# Patient Record
Sex: Female | Born: 1975 | Hispanic: No | Marital: Married | Smoking: Never smoker
Health system: Southern US, Community
[De-identification: ages and names within clinical notes are randomized; demographics above are authoritative.]

## PROBLEM LIST (undated history)

## (undated) DIAGNOSIS — E7800 Pure hypercholesterolemia, unspecified: Secondary | ICD-10-CM

## (undated) DIAGNOSIS — E785 Hyperlipidemia, unspecified: Secondary | ICD-10-CM

## (undated) DIAGNOSIS — O1490 Unspecified pre-eclampsia, unspecified trimester: Secondary | ICD-10-CM

## (undated) HISTORY — PX: APPENDECTOMY: SHX54

## (undated) HISTORY — PX: HERNIA REPAIR: SHX51

---

## 1998-10-20 ENCOUNTER — Inpatient Hospital Stay (HOSPITAL_COMMUNITY): Admission: AD | Admit: 1998-10-20 | Discharge: 1998-10-22 | Payer: Self-pay | Admitting: Obstetrics and Gynecology

## 1998-11-21 ENCOUNTER — Encounter (HOSPITAL_COMMUNITY): Admission: RE | Admit: 1998-11-21 | Discharge: 1999-02-19 | Payer: Self-pay | Admitting: Obstetrics and Gynecology

## 2004-09-01 ENCOUNTER — Inpatient Hospital Stay (HOSPITAL_COMMUNITY): Admission: RE | Admit: 2004-09-01 | Discharge: 2004-09-01 | Payer: Self-pay | Admitting: Obstetrics and Gynecology

## 2004-09-02 ENCOUNTER — Inpatient Hospital Stay (HOSPITAL_COMMUNITY): Admission: AD | Admit: 2004-09-02 | Discharge: 2004-09-04 | Payer: Self-pay | Admitting: Obstetrics and Gynecology

## 2004-10-11 ENCOUNTER — Encounter: Admission: RE | Admit: 2004-10-11 | Discharge: 2004-10-11 | Payer: Self-pay | Admitting: Obstetrics and Gynecology

## 2005-01-22 ENCOUNTER — Encounter: Admission: RE | Admit: 2005-01-22 | Discharge: 2005-01-22 | Payer: Self-pay | Admitting: Obstetrics and Gynecology

## 2015-10-27 ENCOUNTER — Emergency Department (HOSPITAL_BASED_OUTPATIENT_CLINIC_OR_DEPARTMENT_OTHER)
Admission: EM | Admit: 2015-10-27 | Discharge: 2015-10-27 | Disposition: A | Discharge status: Home / Self Care | Payer: Medicaid Other | Attending: Emergency Medicine | Admitting: Emergency Medicine

## 2015-10-27 ENCOUNTER — Emergency Department (HOSPITAL_BASED_OUTPATIENT_CLINIC_OR_DEPARTMENT_OTHER): Payer: Medicaid Other

## 2015-10-27 ENCOUNTER — Encounter (HOSPITAL_BASED_OUTPATIENT_CLINIC_OR_DEPARTMENT_OTHER): Payer: Self-pay | Admitting: *Deleted

## 2015-10-27 DIAGNOSIS — M25461 Effusion, right knee: Secondary | ICD-10-CM | POA: Diagnosis not present

## 2015-10-27 DIAGNOSIS — L700 Acne vulgaris: Secondary | ICD-10-CM | POA: Diagnosis not present

## 2015-10-27 DIAGNOSIS — M25561 Pain in right knee: Secondary | ICD-10-CM | POA: Diagnosis present

## 2015-10-27 DIAGNOSIS — M7981 Nontraumatic hematoma of soft tissue: Secondary | ICD-10-CM | POA: Diagnosis not present

## 2015-10-27 DIAGNOSIS — R52 Pain, unspecified: Secondary | ICD-10-CM

## 2015-10-27 DIAGNOSIS — Z880 Allergy status to penicillin: Secondary | ICD-10-CM | POA: Diagnosis not present

## 2015-10-27 DIAGNOSIS — M79604 Pain in right leg: Secondary | ICD-10-CM

## 2015-10-27 DIAGNOSIS — M7989 Other specified soft tissue disorders: Secondary | ICD-10-CM

## 2015-10-27 DIAGNOSIS — Z791 Long term (current) use of non-steroidal anti-inflammatories (NSAID): Secondary | ICD-10-CM | POA: Diagnosis not present

## 2015-10-27 MED ORDER — IBUPROFEN 800 MG PO TABS: 800.0000 mg | ORAL_TABLET | Freq: Three times a day (TID) | ORAL | Status: DC

## 2015-10-27 NOTE — ED Provider Notes (Signed)
CSN: [id number]     Arrival date & time 10/27/15  1840 History   First MD Initiated Contact with Patient 10/27/15 1928     Chief Complaint  Patient presents with  . Knee Pain     (Consider location/radiation/quality/duration/timing/severity/associated sxs/prior Treatment) HPI   Patient is a 39 year old female who presents to the ED with complaint of right knee pain, onset 1 month. Patient reports she has been having constant aching pain to her right knee, pain worse with walking or when going upstairs. She notes she noticed a small amount of swelling and bruising to her right lower leg approximately one week ago. Denies any recent fall, trauma or injury but notes that approximately one month ago she remembers twisting her ankle while wearing heels. She notes she was seen at urgent care today and was advised to be evaluated in the ED due to swelling and concern for DVT. Denies fever, chills, redness, numbness, tingling, weakness. Denies history of blood clots, recent surgery or immobilization, active cancer. Denies taking any medications prior to arrival  History reviewed. No pertinent past medical history. History reviewed. No pertinent past surgical history. No family history on file. Social History  Substance Use Topics  . Smoking status: Never Smoker   . Smokeless tobacco: None  . Alcohol Use: No   OB History    No data available     Review of Systems  Constitutional: Negative for fever and chills.  Respiratory: Negative for shortness of breath.   Cardiovascular: Negative for chest pain.  Gastrointestinal: Negative for nausea, vomiting and abdominal pain.  Musculoskeletal: Positive for joint swelling and arthralgias (right knee).  Neurological: Negative for weakness, numbness and headaches.      Allergies  Penicillins  Home Medications   Prior to Admission medications   Medication Sig Start Date End Date Taking? Authorizing Provider  ibuprofen (ADVIL,MOTRIN) 800 MG  tablet Take 1 tablet (800 mg total) by mouth 3 (three) times daily. 10/27/15   Satira Sark Nadeau, PA-C   BP 118/76 mmHg  Pulse 80  Temp(Src) 98.1 F (36.7 C) (Oral)  Resp 16  Ht 5' (1.524 m)  Wt 80.74 kg  BMI 34.76 kg/m2  SpO2 100% Physical Exam  Constitutional: She is oriented to person, place, and time. She appears well-developed and well-nourished. No distress.  HENT:  Head: Normocephalic and atraumatic.  Eyes: Conjunctivae and EOM are normal. Right eye exhibits no discharge. Left eye exhibits no discharge. No scleral icterus.  Neck: Normal range of motion. Neck supple.  Cardiovascular: Normal rate, regular rhythm, normal heart sounds and intact distal pulses.   Pulmonary/Chest: Effort normal and breath sounds normal. No respiratory distress. She has no wheezes. She has no rales. She exhibits no tenderness.  Abdominal: Soft. Bowel sounds are normal. She exhibits no distension and no mass. There is no tenderness. There is no rebound and no guarding.  Musculoskeletal: Normal range of motion.       Right knee: She exhibits swelling. She exhibits normal range of motion, no effusion, no ecchymosis, no deformity, no laceration, no erythema, normal alignment, no LCL laxity, normal patellar mobility, normal meniscus and no MCL laxity. Tenderness found. Lateral joint line and patellar tendon tenderness noted.       Legs: Mild swelling noted to right lower extremity extending up to knee. Full range of motion of right knee. 5/5 strength. Sensation intact. 2+ DP pulses.  Neurological: She is alert and oriented to person, place, and time.  Skin: Skin is warm and  dry. She is not diaphoretic.  Nursing note and vitals reviewed.   ED Course  Procedures (including critical care time) Labs Review Labs Reviewed - No data to display  Imaging Review US Venous Img Lower Unilateral Right  10/27/2015  CLINICAL DATA:  Pain, swelling and ecchymosis at the medial aspect of the right knee. EXAM: Right  LOWER EXTREMITY VENOUS DOPPLER ULTRASOUND TECHNIQUE: Gray-scale sonography with graded compression, as well as color Doppler and duplex ultrasound were performed to evaluate the lower extremity deep venous systems from the level of the common femoral vein and including the common femoral, femoral, profunda femoral, popliteal and calf veins including the posterior tibial, peroneal and gastrocnemius veins when visible. The superficial great saphenous vein was also interrogated. Spectral Doppler was utilized to evaluate flow at rest and with distal augmentation maneuvers in the common femoral, femoral and popliteal veins. COMPARISON:  None. FINDINGS: Contralateral Common Femoral Vein: Respiratory phasicity is normal and symmetric with the symptomatic side. No evidence of thrombus. Normal compressibility. Common Femoral Vein: No evidence of thrombus. Normal compressibility, respiratory phasicity and response to augmentation. Saphenofemoral Junction: No evidence of thrombus. Normal compressibility and flow on color Doppler imaging. Profunda Femoral Vein: No evidence of thrombus. Normal compressibility and flow on color Doppler imaging. Femoral Vein: No evidence of thrombus. Normal compressibility, respiratory phasicity and response to augmentation. Popliteal Vein: No evidence of thrombus. Normal compressibility, respiratory phasicity and response to augmentation. Calf Veins: No evidence of thrombus. Normal compressibility and flow on color Doppler imaging. Superficial Great Saphenous Vein: No evidence of thrombus. Normal compressibility and flow on color Doppler imaging. Venous Reflux:  None. Other Findings: In the area of concern at the medial aspect of the right knee there is a complex medium level echogenicity collection which may represent a hematoma. IMPRESSION: No evidence of deep venous thrombosis. Possible hematoma at the medial aspect of the right knee, not conclusively characterized. Electronically Signed   By:  Ellery Plunk M.D.   On: 10/27/2015 21:50   Dg Knee Complete 4 Views Right  10/27/2015  CLINICAL DATA:  Right knee pain and swelling for a month without known trauma. EXAM: RIGHT KNEE - COMPLETE 4+ VIEW COMPARISON:  None. FINDINGS: There is no evidence of fracture, dislocation, or joint effusion. There is no evidence of arthropathy or other focal bone abnormality. Soft tissues are unremarkable. IMPRESSION: Negative. Electronically Signed   By: Ellery Plunk M.D.   On: 10/27/2015 22:07   I have personally reviewed and evaluated these images and lab results as part of my medical decision-making.  Filed Vitals:   10/27/15 1845 10/27/15 2112  BP: 143/94 118/76  Pulse: 94 80  Temp: 98.1 F (36.7 C)   Resp: 16 16     MDM   Final diagnoses:  Right knee pain    Patient presents with right knee pain and swelling for the past month. Denies any known fall, trauma, injury. Denies history of blood clots or recent surgery/immobilization. Patient was seen at urgent care prior to arrival and advised to come to the ED due to concern for DVT. VSS. Exam revealed mild swelling and ecchymosis to area inferior to right medial knee. Mild swelling noted to right lower extremity compared to left, no calf pain. Right lower extremity neurovascularly intact. Pt is able ambulate, no bony abnormality or deformity, no erythema or excessive heat, no evidence of cellulitis, or septic joint.  Dr. Cyndie Chime evaluated pt.  Plan to order Doppler ultrasound to evaluate for DVT. During exam she  noticed a small blackhead to right lower back, unable to expel blackhead with manual pressure.Lower extremity ultrasound showed no evidence of DVT. Right knee x-ray negative. Discussed results and plan for discharge. Patient given prescription for NSAIDs and follow-up for orthopedists. Patient also given dermatology follow-up regarding skin lesion.  Evaluation does not show pathology requring ongoing emergent intervention or  admission. Pt is hemodynamically stable and mentating appropriately. All questions answered. Return precautions discussed and outpatient follow up given.      Satira Sarkicole Elizabeth IvanhoeNadeau, [place] JerseyPA-C 10/27/15 2230  Leta BaptistEmily Roe Nguyen, MD 10/28/15 1014

## 2015-10-27 NOTE — Discharge Instructions (Signed)
Take your medication as prescribed as needed for pain relief. You may also apply ice for 15-20 minutes to affected area 3-4 times daily as needed for pain relief. Follow up with orthopedics regarding your knee pain. I also recommend following up with dermatology regarding the skin lesion/pimple on your back. Please return to the Emergency Department if symptoms worsen or new onset of fever, numbness, tingling, weakness.

## 2015-10-27 NOTE — ED Notes (Signed)
Right knee pain for a month. [facility] and told to come to the ED due to swelling.

## 2015-11-23 ENCOUNTER — Ambulatory Visit: Payer: Medicaid Other

## 2015-12-07 ENCOUNTER — Ambulatory Visit: Payer: Medicaid Other

## 2018-12-07 ENCOUNTER — Other Ambulatory Visit: Payer: Self-pay

## 2018-12-07 ENCOUNTER — Emergency Department (HOSPITAL_BASED_OUTPATIENT_CLINIC_OR_DEPARTMENT_OTHER): Payer: Medicaid Other

## 2018-12-07 ENCOUNTER — Encounter (HOSPITAL_BASED_OUTPATIENT_CLINIC_OR_DEPARTMENT_OTHER): Payer: Self-pay

## 2018-12-07 ENCOUNTER — Emergency Department (HOSPITAL_BASED_OUTPATIENT_CLINIC_OR_DEPARTMENT_OTHER)
Admission: EM | Admit: 2018-12-07 | Discharge: 2018-12-08 | Disposition: A | Discharge status: Home / Self Care | Payer: Medicaid Other | Attending: Emergency Medicine | Admitting: Emergency Medicine

## 2018-12-07 DIAGNOSIS — R079 Chest pain, unspecified: Secondary | ICD-10-CM | POA: Insufficient documentation

## 2018-12-07 DIAGNOSIS — R6889 Other general symptoms and signs: Secondary | ICD-10-CM

## 2018-12-07 DIAGNOSIS — R42 Dizziness and giddiness: Secondary | ICD-10-CM | POA: Diagnosis present

## 2018-12-07 DIAGNOSIS — Z7902 Long term (current) use of antithrombotics/antiplatelets: Secondary | ICD-10-CM | POA: Insufficient documentation

## 2018-12-07 DIAGNOSIS — Z79899 Other long term (current) drug therapy: Secondary | ICD-10-CM | POA: Diagnosis not present

## 2018-12-07 DIAGNOSIS — J111 Influenza due to unidentified influenza virus with other respiratory manifestations: Secondary | ICD-10-CM | POA: Insufficient documentation

## 2018-12-07 DIAGNOSIS — R509 Fever, unspecified: Secondary | ICD-10-CM

## 2018-12-07 HISTORY — DX: Unspecified pre-eclampsia, unspecified trimester: O14.90

## 2018-12-07 HISTORY — DX: Hyperlipidemia, unspecified: E78.5

## 2018-12-07 LAB — URINALYSIS, ROUTINE W REFLEX MICROSCOPIC
Bilirubin Urine: NEGATIVE
Glucose, UA: NEGATIVE mg/dL
Ketones, ur: NEGATIVE mg/dL
Leukocytes, UA: NEGATIVE
Nitrite: NEGATIVE
Protein, ur: NEGATIVE mg/dL
Specific Gravity, Urine: <1.005 — ABNORMAL LOW (ref 1.005–1.030)
pH: 5.5 (ref 5.0–8.0)

## 2018-12-07 LAB — COMPREHENSIVE METABOLIC PANEL
ALT: 19 U/L (ref 0–44)
AST: 16 U/L (ref 15–41)
Albumin: 3.9 g/dL (ref 3.5–5.0)
Alkaline Phosphatase: 44 U/L (ref 38–126)
Anion gap: 10 (ref 5–15)
BUN: 13 mg/dL (ref 6–20)
CO2: 22 mmol/L (ref 22–32)
Calcium: 8.9 mg/dL (ref 8.9–10.3)
Chloride: 102 mmol/L (ref 98–111)
Creatinine, Ser: 0.87 mg/dL (ref 0.44–1.00)
GFR calc Af Amer: >60 mL/min (ref >60)
GFR calc non Af Amer: >60 mL/min (ref >60)
Glucose, Bld: 138 mg/dL — ABNORMAL HIGH (ref 70–99)
Potassium: 3.9 mmol/L (ref 3.5–5.1)
Sodium: 134 mmol/L — ABNORMAL LOW (ref 135–145)
Total Bilirubin: 0.4 mg/dL (ref 0.3–1.2)
Total Protein: 7.2 g/dL (ref 6.5–8.1)

## 2018-12-07 LAB — CBC WITH DIFFERENTIAL/PLATELET
Abs Immature Granulocytes: 0 10*3/uL (ref 0.00–0.07)
Basophils Absolute: 0 10*3/uL (ref 0.0–0.1)
Basophils Relative: 0 %
Eosinophils Absolute: 0 10*3/uL (ref 0.0–0.5)
Eosinophils Relative: 1 %
HCT: 39.2 % (ref 36.0–46.0)
Hemoglobin: 12.4 g/dL (ref 12.0–15.0)
Immature Granulocytes: 0 %
Lymphocytes Relative: 9 %
Lymphs Abs: 0.3 10*3/uL — ABNORMAL LOW (ref 0.7–4.0)
MCH: 28.2 pg (ref 26.0–34.0)
MCHC: 31.6 g/dL (ref 30.0–36.0)
MCV: 89.1 fL (ref 80.0–100.0)
Monocytes Absolute: 0.4 10*3/uL (ref 0.1–1.0)
Monocytes Relative: 12 %
Neutro Abs: 2.8 10*3/uL (ref 1.7–7.7)
Neutrophils Relative %: 78 %
Platelets: 144 10*3/uL — ABNORMAL LOW (ref 150–400)
RBC: 4.4 MIL/uL (ref 3.87–5.11)
RDW: 13.3 % (ref 11.5–15.5)
WBC: 3.6 10*3/uL — ABNORMAL LOW (ref 4.0–10.5)
nRBC: 0 % (ref 0.0–0.2)

## 2018-12-07 LAB — I-STAT CG4 LACTIC ACID, ED: Lactic Acid, Venous: 1.5 mmol/L (ref 0.5–1.9)

## 2018-12-07 LAB — URINALYSIS, MICROSCOPIC (REFLEX)

## 2018-12-07 LAB — CBG MONITORING, ED: Glucose-Capillary: 109 mg/dL — ABNORMAL HIGH (ref 70–99)

## 2018-12-07 LAB — PREGNANCY, URINE: Preg Test, Ur: NEGATIVE

## 2018-12-07 LAB — TROPONIN I: Troponin I: <0.03 ng/mL (ref <0.03)

## 2018-12-07 MED ORDER — SODIUM CHLORIDE 0.9 % IV BOLUS
1000.0000 mL | Freq: Once | INTRAVENOUS | Status: AC
  Administered 2018-12-07: 1000 mL via INTRAVENOUS

## 2018-12-07 MED ORDER — KETOROLAC TROMETHAMINE 15 MG/ML IJ SOLN
15.0000 mg | Freq: Once | INTRAMUSCULAR | Status: AC
  Administered 2018-12-07: 15 mg via INTRAVENOUS
  Filled 2018-12-07: qty 1

## 2018-12-07 MED ORDER — SODIUM CHLORIDE 0.9 % IV BOLUS
1000.0000 mL | Freq: Once | INTRAVENOUS | Status: AC
  Administered 2018-12-08: 1000 mL via INTRAVENOUS

## 2018-12-07 MED ORDER — ACETAMINOPHEN 500 MG PO TABS
1000.0000 mg | ORAL_TABLET | Freq: Once | ORAL | Status: AC
  Administered 2018-12-07: 1000 mg via ORAL
  Filled 2018-12-07: qty 2

## 2018-12-07 NOTE — ED Triage Notes (Signed)
Pt presents with hypertension, dizziness, fever and weakness. Symptoms began this AM.

## 2018-12-07 NOTE — ED Notes (Addendum)
Pt c/o dizziness, weakness and generalized body aches that started today. Pts general appearance is mildly lethargic with c/o tiredness and pain. Pt denies nausea or vomiting. Pts c/o of chills. Pt also states she is non compliant with b/p and cholesterol medications.

## 2018-12-08 MED ORDER — MECLIZINE HCL 25 MG PO TABS
25.0000 mg | ORAL_TABLET | Freq: Once | ORAL | Status: AC
  Administered 2018-12-08: 25 mg via ORAL
  Filled 2018-12-08: qty 1

## 2018-12-08 MED ORDER — OSELTAMIVIR PHOSPHATE 75 MG PO CAPS
75.0000 mg | ORAL_CAPSULE | Freq: Once | ORAL | Status: AC
  Administered 2018-12-08: 75 mg via ORAL
  Filled 2018-12-08: qty 1

## 2018-12-08 MED ORDER — OSELTAMIVIR PHOSPHATE 75 MG PO CAPS: 75.0000 mg | ORAL_CAPSULE | Freq: Two times a day (BID) | ORAL | 0 refills | Status: AC

## 2018-12-08 MED ORDER — IBUPROFEN 400 MG PO TABS
600.0000 mg | ORAL_TABLET | Freq: Once | ORAL | Status: AC
  Administered 2018-12-08: 600 mg via ORAL
  Filled 2018-12-08: qty 1

## 2018-12-08 NOTE — ED Notes (Signed)
Pt amublated without any assistance. SpO2 98-99, Heart rate 108-109.

## 2018-12-08 NOTE — ED Provider Notes (Addendum)
Planes of frontal headache diffuse myalgias fever and lightheadedness onset gradually this morning becoming worse at 6 PM tonight.  Denies cough or sore throat she denies neck pain or neck stiffness.  On exam she is alert nontoxic appearing HEENT exam oropharynx is normal.  Mucous membranes moist neck is supple no signs of meningitis heart tachycardic regular rhythm no murmurs all 4 extremities are redness swelling or tenderness neurovascularly intact.  I suspect the patient has early stages of flulike illness.  Strongly doubt meningitis.  Frontal headache.  No neck pain or stiffness.  Nontoxic-appearing suggest close follow-up with primary care physician   Doug Sou, MD 12/08/18 6468    Doug Sou, MD 12/08/18 [phone]

## 2018-12-08 NOTE — ED Provider Notes (Signed)
[facility] EMERGENCY DEPARTMENT Provider Note   CSN: [id number] Arrival date & time: 12/07/18  2147     History   Chief Complaint Chief Complaint  Patient presents with  . Dizziness    HPI Elizabeth Hampton is a 43 y.o. female presenting for evaluation of body aches, headache, and weakness.  Patient states symptoms began this morning when she woke up.  They have gradually worsened throughout today.  Patient states she is having a persistent frontal headache.  Additionally, she reports pain all over her body.  She feels like she is very hot.  When she was walking today, she felt lightheaded.  She states she feels like her heart is racing.  She denies ear pain, nasal congestion, sore throat, cough, chest pain, nausea, vomiting, abdominal pain, urinary symptoms, normal bowel movements.  She denies sick contacts.  She denies recent travel.  She reports a history of hypertension and hyperlipidemia for which she is noncompliant with her medications.  She denies tobacco use.  HPI  Past Medical History:  Diagnosis Date  . Hyperlipidemia   . Preeclampsia     There are no active problems to display for this patient.   History reviewed. No pertinent surgical history.   OB History   No obstetric history on file.      Home Medications    Prior to Admission medications   Medication Sig Start Date End Date Taking? Authorizing Provider  hydrochlorothiazide (HYDRODIURIL) 12.5 MG tablet Take by mouth. 10/02/17  Yes [provider]  pravastatin (PRAVACHOL) 10 MG tablet Take by mouth. 12/04/17  Yes [provider]  ibuprofen (ADVIL,MOTRIN) 800 MG tablet Take 1 tablet (800 mg total) by mouth 3 (three) times daily. 10/27/15   Barrett Henle, PA-C  oseltamivir (TAMIFLU) 75 MG capsule Take 1 capsule (75 mg total) by mouth every 12 (twelve) hours for 5 days. 12/08/18 12/13/18  Sharnise Blough, PA-C    Family History No family history on file.  Social  History Social History   Tobacco Use  . Smoking status: Never Smoker  . Smokeless tobacco: Never Used  Substance Use Topics  . Alcohol use: No  . Drug use: No     Allergies   Atorvastatin and Penicillins   Review of Systems Review of Systems  Constitutional: Positive for fever.  Cardiovascular: Positive for palpitations.  Musculoskeletal: Positive for myalgias.  Neurological: Positive for light-headedness and headaches.  All other systems reviewed and are negative.    Physical Exam Updated Vital Signs BP 112/71 (BP Location: Right Arm) Comment: Simultaneous filing. User may not have seen previous data.  Pulse 98   Temp (!) 100.5 F (38.1 C) (Rectal)   Resp (!) 24   Ht 5\' 6"  (1.676 m)   Wt 77.1 kg   LMP 11/15/2018   SpO2 96%   BMI 27.44 kg/m   Physical Exam Vitals signs and nursing note reviewed.  Constitutional:      General: She is not in acute distress.    Appearance: She is well-developed.     Comments: Appears uncomfortable, but in no acute distress  HENT:     Head: Normocephalic and atraumatic.     Comments: OP clear without tonsillar swelling or exudate.  Uvula midline with equal palate rise.  TMs nonerythematous nonbulging bilaterally. Eyes:     Conjunctiva/sclera: Conjunctivae normal.     Pupils: Pupils are equal, round, and reactive to light.     Comments: EOMI and PERRLA.  No nystagmus.  Neck:  Musculoskeletal: Normal range of motion and neck supple.     Comments: No neck stiffness or signs of meningismus Cardiovascular:     Rate and Rhythm: Regular rhythm. Tachycardia present.     Pulses: Normal pulses.     Comments: tachycardiac around 140 Pulmonary:     Effort: Pulmonary effort is normal. No respiratory distress.     Breath sounds: Normal breath sounds. No wheezing.     Comments: Speaking in full sentences.  Clear lung sounds in all fields. Abdominal:     General: There is no distension.     Palpations: Abdomen is soft. There is no  mass.     Tenderness: There is no abdominal tenderness. There is no guarding or rebound.  Musculoskeletal: Normal range of motion.     Comments: Strength intact x4.  Sensation intact x4.  Radial pedal pulses intact bilaterally.  No leg pain or swelling.  Skin:    General: Skin is warm and dry.     Capillary Refill: Capillary refill takes less than 2 seconds.  Neurological:     General: No focal deficit present.     Mental Status: She is alert and oriented to person, place, and time.     Comments: No obvious neurologic deficit.  CN intact.  Grip strength intact.      ED Treatments / Results  Labs (all labs ordered are listed, but only abnormal results are displayed) Labs Reviewed  URINALYSIS, ROUTINE W REFLEX MICROSCOPIC - Abnormal; Notable for the following components:      Result Value   Specific Gravity, Urine <1.005 (*)    Hgb urine dipstick TRACE (*)    All other components within normal limits  COMPREHENSIVE METABOLIC PANEL - Abnormal; Notable for the following components:   Sodium 134 (*)    Glucose, Bld 138 (*)    All other components within normal limits  CBC WITH DIFFERENTIAL/PLATELET - Abnormal; Notable for the following components:   WBC 3.6 (*)    Platelets 144 (*)    Lymphs Abs 0.3 (*)    All other components within normal limits  URINALYSIS, MICROSCOPIC (REFLEX) - Abnormal; Notable for the following components:   Bacteria, UA RARE (*)    All other components within normal limits  CBG MONITORING, ED - Abnormal; Notable for the following components:   Glucose-Capillary 109 (*)    All other components within normal limits  CULTURE, BLOOD (ROUTINE X 2)  CULTURE, BLOOD (ROUTINE X 2)  PREGNANCY, URINE  TROPONIN I  I-STAT CG4 LACTIC ACID, ED    EKG EKG Interpretation  Date/Time:  Sunday December 07 2018 22:00:54 EST Ventricular Rate:  137 PR Interval:  116 QRS Duration: 76 QT Interval:  292 QTC Calculation: 440 R Axis:   66 Text Interpretation:  Sinus  tachycardia Otherwise normal ECG No old tracing to compare Confirmed by Satilla, Doreatha Martin [phone]) on 12/07/2018 10:17:07 PM   Radiology Dg Chest 2 View  Result Date: 12/07/2018 CLINICAL DATA:  Chest pain EXAM: CHEST - 2 VIEW COMPARISON:  None. FINDINGS: Minimal bibasilar opacities, likely atelectasis. No pleural effusion or pneumothorax. The heart is normal in size. Visualized osseous structures are within normal limits. IMPRESSION: No evidence of acute cardiopulmonary disease. Electronically Signed   By: Charline Bills M.D.   On: 12/07/2018 23:52    Procedures Procedures (including critical care time)  Medications Ordered in ED Medications  acetaminophen (TYLENOL) tablet 1,000 mg (1,000 mg Oral Given 12/07/18 2224)  sodium chloride 0.9 % bolus 1,000  mL (0 mLs Intravenous Stopped 12/07/18 2324)  ketorolac (TORADOL) 15 MG/ML injection 15 mg (15 mg Intravenous Given 12/07/18 2324)  sodium chloride 0.9 % bolus 1,000 mL (0 mLs Intravenous Stopped 12/08/18 0141)  oseltamivir (TAMIFLU) capsule 75 mg (75 mg Oral Given 12/08/18 0026)  ibuprofen (ADVIL,MOTRIN) tablet 600 mg (600 mg Oral Given 12/08/18 0026)  meclizine (ANTIVERT) tablet 25 mg (25 mg Oral Given 12/08/18 0141)     Initial Impression / Assessment and Plan / ED Course  I have reviewed the triage vital signs and the nursing notes.  Pertinent labs & imaging results that were available during my care of the patient were reviewed by me and considered in my medical decision making (see chart for details).     Patient presenting for evaluation of fever, palpitations, lightheadedness, and headache.  Physical exam shows patient who is febrile and tachycardic.  Has symptoms again all the setting, with very elevated temperature of 104, consider flu.  However, patient without other flulike symptoms.  Headache is frontal, and no signs of neck stiffness or meningismus.  Low suspicion for meningitis at this time.  Will start infectious work-up with  labs, lactic, urine, chest x-ray.  Additionally, as patient is tachycardic and having palpitations, will obtain EKG and troponin.  Will give fluids and tylneol for sx control.   EKG shows sinus tach, no STEMI.  Troponin negative.  Labs reassuring, lactic negative.  Mild leukopenia, consistent with viral illness.  Otherwise electrolytes are reassuring.  Chest x-ray viewed interpreted by me, no pneumonia, pneumothorax, effusion, or cardiomegaly.  Urine without infection.  Heart rate improving, 120.  Fever improving to 103.  Will give Toradol for continued aches and fever.  Second liter of fluid ordered.  Case discussed with attending, Dr. Ethelda ChickJacubowitz evaluated the patient.  Heart rate continuing to improve, went down to 98.  Temperature improved to 100.5.  sxs likely flu ir other virus. Will treat empirically with tamiflu. Patient ambulated without difficulty or signs of shortness of breath.  Encouraged close follow-up with PCP. At this time, pt appears safe for d/c. Return precautions given. Pt states she understands and agrees to plan.  Final Clinical Impressions(s) / ED Diagnoses   Final diagnoses:  Flu-like symptoms  Fever, unspecified fever cause    ED Discharge Orders         Ordered    oseltamivir (TAMIFLU) 75 MG capsule  Every 12 hours     12/08/18 0132           Alveria ApleyCaccavale, Raiden Haydu, PA-C 12/08/18 0156    Doug SouJacubowitz, Sam, MD 12/08/18 1054

## 2018-12-08 NOTE — Discharge Instructions (Addendum)
I am concerned your symptoms are early flu.  The flu is a virus, which is treated symptomatically. We are giving medication for flu called tamiflu. This may cause nausea and/or diarrhea.  Use Tylenol and ibuprofen as needed for fever or body aches.  As the medicine wears off, your fever and aches will likely return.  This will continue for several days. Make sure you are staying well-hydrated with water.  This is very important. You may develop cough, sore throat, congestion over the next several days.  Treat symptomatically. Follow-up with your primary care doctor tomorrow for recheck of your symptoms. Return to the emergency room with any new, worsening, concerning symptoms.

## 2018-12-13 LAB — CULTURE, BLOOD (ROUTINE X 2)
CULTURE: NO GROWTH
CULTURE: NO GROWTH
Special Requests: ADEQUATE
Special Requests: ADEQUATE

## 2020-12-07 ENCOUNTER — Emergency Department (HOSPITAL_BASED_OUTPATIENT_CLINIC_OR_DEPARTMENT_OTHER)
Admission: EM | Admit: 2020-12-07 | Discharge: 2020-12-07 | Disposition: A | Discharge status: Home / Self Care | Payer: Medicaid Other | Attending: Emergency Medicine | Admitting: Emergency Medicine

## 2020-12-07 ENCOUNTER — Encounter (HOSPITAL_BASED_OUTPATIENT_CLINIC_OR_DEPARTMENT_OTHER): Payer: Self-pay | Admitting: Emergency Medicine

## 2020-12-07 ENCOUNTER — Other Ambulatory Visit: Payer: Self-pay

## 2020-12-07 DIAGNOSIS — U071 COVID-19: Secondary | ICD-10-CM | POA: Diagnosis not present

## 2020-12-07 DIAGNOSIS — Z20822 Contact with and (suspected) exposure to covid-19: Secondary | ICD-10-CM

## 2020-12-07 DIAGNOSIS — R059 Cough, unspecified: Secondary | ICD-10-CM | POA: Diagnosis present

## 2020-12-07 LAB — SARS CORONAVIRUS 2 (TAT 6-24 HRS): SARS Coronavirus 2: POSITIVE — AB

## 2020-12-07 MED ORDER — IBUPROFEN 800 MG PO TABS
800.0000 mg | ORAL_TABLET | Freq: Once | ORAL | Status: AC
  Administered 2020-12-07: 800 mg via ORAL
  Filled 2020-12-07: qty 1

## 2020-12-07 MED ORDER — LIDOCAINE VISCOUS HCL 2 % MT SOLN
15.0000 mL | Freq: Once | OROMUCOSAL | Status: AC
  Administered 2020-12-07: 15 mL via OROMUCOSAL
  Filled 2020-12-07: qty 15

## 2020-12-07 NOTE — ED Triage Notes (Signed)
Nasal congestion, cough and scratchy throat.

## 2020-12-07 NOTE — Discharge Instructions (Addendum)
Person Under Monitoring Name: Elizabeth Hampton  Location: [address] [place] [address]   Infection Prevention Recommendations for Individuals Confirmed to have, or Being Evaluated for, 2019 Novel Coronavirus (COVID-19) Infection Who Receive Care at Home  Individuals who are confirmed to have, or are being evaluated for, COVID-19 should follow the prevention steps below until a healthcare provider or local or state health department says they can return to normal activities.  Stay home except to get medical care You should restrict activities outside your home, except for getting medical care. Do not go to work, school, or public areas, and do not use public transportation or taxis.  Call ahead before visiting your doctor Before your medical appointment, call the healthcare provider and tell them that you have, or are being evaluated for, COVID-19 infection. This will help the healthcare provider's office take steps to keep other people from getting infected. Ask your healthcare provider to call the local or state health department.  Monitor your symptoms Seek prompt medical attention if your illness is worsening (e.g., difficulty breathing). Before going to your medical appointment, call the healthcare provider and tell them that you have, or are being evaluated for, COVID-19 infection. Ask your healthcare provider to call the local or state health department.  Wear a facemask You should wear a facemask that covers your nose and mouth when you are in the same room with other people and when you visit a healthcare provider. People who live with or visit you should also wear a facemask while they are in the same room with you.  Separate yourself from other people in your home As much as possible, you should stay in a different room from other people in your home. Also, you should use a separate bathroom, if available.  Avoid sharing household items You should not  share dishes, drinking glasses, cups, eating utensils, towels, bedding, or other items with other people in your home. After using these items, you should wash them thoroughly with soap and water.  Cover your coughs and sneezes Cover your mouth and nose with a tissue when you cough or sneeze, or you can cough or sneeze into your sleeve. Throw used tissues in a lined trash can, and immediately wash your hands with soap and water for at least 20 seconds or use an alcohol-based hand rub.  Wash your [employer] your hands often and thoroughly with soap and water for at least 20 seconds. You can use an alcohol-based hand sanitizer if soap and water are not available and if your hands are not visibly dirty. Avoid touching your eyes, nose, and mouth with unwashed hands.   Prevention Steps for Caregivers and Household Members of Individuals Confirmed to have, or Being Evaluated for, COVID-19 Infection Being Cared for in the Home  If you live with, or provide care at home for, a person confirmed to have, or being evaluated for, COVID-19 infection please follow these guidelines to prevent infection:  Follow healthcare provider's instructions Make sure that you understand and can help the patient follow any healthcare provider instructions for all care.  Provide for the patient's basic needs You should help the patient with basic needs in the home and provide support for getting groceries, prescriptions, and other personal needs.  Monitor the patient's symptoms If they are getting sicker, call his or her medical provider and tell them that the patient has, or is being evaluated for, COVID-19 infection. This will help the healthcare provider's office take  steps to keep other people from getting infected. Ask the healthcare provider to call the local or state health department.  Limit the number of people who have contact with the patient If possible, have only one caregiver for the  patient. Other household members should stay in another home or place of residence. If this is not possible, they should stay in another room, or be separated from the patient as much as possible. Use a separate bathroom, if available. Restrict visitors who do not have an essential need to be in the home.  Keep older adults, very young children, and other sick people away from the patient Keep older adults, very young children, and those who have compromised immune systems or chronic health conditions away from the patient. This includes people with chronic heart, lung, or kidney conditions, diabetes, and cancer.  Ensure good ventilation Make sure that shared spaces in the home have good air flow, such as from an air conditioner or an opened window, weather permitting.  Wash your hands often Wash your hands often and thoroughly with soap and water for at least 20 seconds. You can use an alcohol based hand sanitizer if soap and water are not available and if your hands are not visibly dirty. Avoid touching your eyes, nose, and mouth with unwashed hands. Use disposable paper towels to dry your hands. If not available, use dedicated cloth towels and replace them when they become wet.  Wear a facemask and gloves Wear a disposable facemask at all times in the room and gloves when you touch or have contact with the patient's blood, body fluids, and/or secretions or excretions, such as sweat, saliva, sputum, nasal mucus, vomit, urine, or feces.  Ensure the mask fits over your nose and mouth tightly, and do not touch it during use. Throw out disposable facemasks and gloves after using them. Do not reuse. Wash your hands immediately after removing your facemask and gloves. If your personal clothing becomes contaminated, carefully remove clothing and launder. Wash your hands after handling contaminated clothing. Place all used disposable facemasks, gloves, and other waste in a lined container before  disposing them with other household waste. Remove gloves and wash your hands immediately after handling these items.  Do not share dishes, glasses, or other household items with the patient Avoid sharing household items. You should not share dishes, drinking glasses, cups, eating utensils, towels, bedding, or other items with a patient who is confirmed to have, or being evaluated for, COVID-19 infection. After the person uses these items, you should wash them thoroughly with soap and water.  Wash laundry thoroughly Immediately remove and wash clothes or bedding that have blood, body fluids, and/or secretions or excretions, such as sweat, saliva, sputum, nasal mucus, vomit, urine, or feces, on them. Wear gloves when handling laundry from the patient. Read and follow directions on labels of laundry or clothing items and detergent. In general, wash and dry with the warmest temperatures recommended on the label.  Clean all areas the individual has used often Clean all touchable surfaces, such as counters, tabletops, doorknobs, bathroom fixtures, toilets, phones, keyboards, tablets, and bedside tables, every day. Also, clean any surfaces that may have blood, body fluids, and/or secretions or excretions on them. Wear gloves when cleaning surfaces the patient has come in contact with. Use a diluted bleach solution (e.g., dilute bleach with 1 part bleach and 10 parts water) or a household disinfectant with a label that says EPA-registered for coronaviruses. To make a bleach solution  at home, add 1 tablespoon of bleach to 1 quart (4 cups) of water. For a larger supply, add  cup of bleach to 1 gallon (16 cups) of water. Read labels of cleaning products and follow recommendations provided on product labels. Labels contain instructions for safe and effective use of the cleaning product including precautions you should take when applying the product, such as wearing gloves or eye protection and making sure you  have good ventilation during use of the product. Remove gloves and wash hands immediately after cleaning.  Monitor yourself for signs and symptoms of illness Caregivers and household members are considered close contacts, should monitor their health, and will be asked to limit movement outside of the home to the extent possible. Follow the monitoring steps for close contacts listed on the symptom monitoring form.   ? If you have additional questions, contact your local health department or call the epidemiologist on call at [phone] (available 24/7). ? This guidance is subject to change. For the most up-to-date guidance from [facility], please refer to their website: [email or web address]

## 2020-12-07 NOTE — ED Provider Notes (Signed)
[facility] EMERGENCY DEPARTMENT Provider Note   CSN: [id number] Arrival date & time: 12/07/20  0423     History Chief Complaint  Patient presents with  . Cough    Elizabeth Hampton is a 45 y.o. female.  The history is provided by the patient.  Cough Cough characteristics:  Non-productive Severity:  Moderate Onset quality:  Gradual Timing:  Sporadic Progression:  Unchanged Chronicity:  New Context: sick contacts and upper respiratory infection   Context: not animal exposure   Relieved by:  Nothing Worsened by:  Nothing Ineffective treatments:  None tried Associated symptoms: sore throat   Associated symptoms: no chest pain, no fever and no rash   Risk factors: no chemical exposure        Past Medical History:  Diagnosis Date  . Hyperlipidemia   . Preeclampsia     There are no problems to display for this patient.   History reviewed. No pertinent surgical history.   OB History   No obstetric history on file.     History reviewed. No pertinent family history.  Social History   Tobacco Use  . Smoking status: Never Smoker  . Smokeless tobacco: Never Used  Substance Use Topics  . Alcohol use: No  . Drug use: No    Home Medications Prior to Admission medications   Medication Sig Start Date End Date Taking? Authorizing Provider  pravastatin (PRAVACHOL) 10 MG tablet Take by mouth. 12/04/17   [provider]    Allergies    Atorvastatin and Penicillins  Review of Systems   Review of Systems  Constitutional: Negative for fever.  HENT: Positive for sore throat. Negative for congestion, trouble swallowing and voice change.   Eyes: Negative for visual disturbance.  Respiratory: Positive for cough.   Cardiovascular: Negative for chest pain.  Gastrointestinal: Negative for abdominal pain.  Genitourinary: Negative for difficulty urinating.  Musculoskeletal: Negative for arthralgias.  Skin: Negative for rash.  Neurological: Negative for  dizziness.  Psychiatric/Behavioral: Negative for agitation.  All other systems reviewed and are negative.   Physical Exam Updated Vital Signs BP (!) 141/98   Pulse 86   Temp 98.1 F (36.7 C) (Oral)   Resp 16   Ht 5\' 6"  (1.676 m)   Wt 82.6 kg   SpO2 100%   BMI 29.38 kg/m   Physical Exam Vitals and nursing note reviewed.  Constitutional:      General: She is not in acute distress.    Appearance: Normal appearance.  HENT:     Head: Normocephalic and atraumatic.     Nose: Nose normal.  Eyes:     Conjunctiva/sclera: Conjunctivae normal.     Pupils: Pupils are equal, round, and reactive to light.  Cardiovascular:     Rate and Rhythm: Normal rate and regular rhythm.     Pulses: Normal pulses.     Heart sounds: Normal heart sounds.  Pulmonary:     Effort: Pulmonary effort is normal.     Breath sounds: Normal breath sounds.  Abdominal:     General: Abdomen is flat. Bowel sounds are normal.     Palpations: Abdomen is soft.     Tenderness: There is no abdominal tenderness. There is no guarding.  Musculoskeletal:        General: Normal range of motion.     Cervical back: Normal range of motion and neck supple.  Skin:    General: Skin is warm and dry.     Capillary Refill: Capillary refill takes less  than 2 seconds.  Neurological:     General: No focal deficit present.     Mental Status: She is alert and oriented to person, place, and time.     Deep Tendon Reflexes: Reflexes normal.  Psychiatric:        Mood and Affect: Mood normal.        Behavior: Behavior normal.     ED Results / Procedures / Treatments   Labs (all labs ordered are listed, but only abnormal results are displayed) Labs Reviewed  SARS CORONAVIRUS 2 (TAT 6-24 HRS)    EKG None  Radiology No results found.  Procedures Procedures (including critical care time)  Medications Ordered in ED Medications  ibuprofen (ADVIL) tablet 800 mg (800 mg Oral Given 12/07/20 0528)  lidocaine (XYLOCAINE) 2 %  viscous mouth solution 15 mL (15 mLs Mouth/Throat Given 12/07/20 6269)    ED Course  I have reviewed the triage vital signs and the nursing notes.  Pertinent labs & imaging results that were available during my care of the patient were reviewed by me and considered in my medical decision making (see chart for details).    Well appearing, no indication for further testing or imaging at this time.  COVID sent and will appear in my chart in 24 hours.  Patient placed in home quarantine.    [name] was evaluated in Emergency Department on 12/07/2020 for the symptoms described in the history of present illness. She was evaluated in the context of the global COVID-19 pandemic, which necessitated consideration that the patient might be at risk for infection with the SARS-CoV-2 virus that causes COVID-19. Institutional protocols and algorithms that pertain to the evaluation of patients at risk for COVID-19 are in a state of rapid change based on information released by regulatory bodies including the CDC and federal and state organizations. These policies and algorithms were followed during the patient's care in the ED.  Final Clinical Impression(s) / ED Diagnoses Final diagnoses:  Person under investigation for COVID-19   Return for intractable cough, coughing up blood,fevers >100.4 unrelieved by medication, shortness of breath, intractable vomiting, chest pain, shortness of breath, weakness,numbness, changes in speech, facial asymmetry,abdominal pain, passing out,Inability to tolerate liquids or food, cough, altered mental status or any concerns. No signs of systemic illness or infection. The patient is nontoxic-appearing on exam and vital signs are within normal limits.   I have reviewed the triage vital signs and the nursing notes. Pertinent labs &imaging results that were available during my care of the patient were reviewed by me and considered in my medical decision making (see chart  for details).After history, exam, and medical workup I feel the patient has beenappropriately medically screened and is safe for discharge home. Pertinent diagnoses were discussed with the patient. Patient was given return precautions.      Elizabeth Minix, MD 12/07/20 [phone]

## 2022-01-13 ENCOUNTER — Other Ambulatory Visit: Payer: Self-pay

## 2022-01-13 DIAGNOSIS — F419 Anxiety disorder, unspecified: Secondary | ICD-10-CM | POA: Insufficient documentation

## 2022-01-13 DIAGNOSIS — R072 Precordial pain: Secondary | ICD-10-CM | POA: Diagnosis present

## 2022-01-13 DIAGNOSIS — E876 Hypokalemia: Secondary | ICD-10-CM | POA: Diagnosis not present

## 2022-01-14 ENCOUNTER — Emergency Department (HOSPITAL_BASED_OUTPATIENT_CLINIC_OR_DEPARTMENT_OTHER): Payer: Medicaid Other

## 2022-01-14 ENCOUNTER — Encounter (HOSPITAL_BASED_OUTPATIENT_CLINIC_OR_DEPARTMENT_OTHER): Payer: Self-pay | Admitting: *Deleted

## 2022-01-14 ENCOUNTER — Emergency Department (HOSPITAL_BASED_OUTPATIENT_CLINIC_OR_DEPARTMENT_OTHER)
Admission: EM | Admit: 2022-01-14 | Discharge: 2022-01-14 | Disposition: A | Discharge status: Home / Self Care | Payer: Medicaid Other | Attending: Emergency Medicine | Admitting: Emergency Medicine

## 2022-01-14 DIAGNOSIS — R072 Precordial pain: Secondary | ICD-10-CM

## 2022-01-14 DIAGNOSIS — E876 Hypokalemia: Secondary | ICD-10-CM

## 2022-01-14 HISTORY — DX: Pure hypercholesterolemia, unspecified: E78.00

## 2022-01-14 LAB — BASIC METABOLIC PANEL
Anion gap: 10 (ref 5–15)
BUN: 17 mg/dL (ref 6–20)
CO2: 27 mmol/L (ref 22–32)
Calcium: 9.4 mg/dL (ref 8.9–10.3)
Chloride: 97 mmol/L — ABNORMAL LOW (ref 98–111)
Creatinine, Ser: 0.79 mg/dL (ref 0.44–1.00)
GFR, Estimated: >60 mL/min (ref >60)
Glucose, Bld: 111 mg/dL — ABNORMAL HIGH (ref 70–99)
Potassium: 2.9 mmol/L — ABNORMAL LOW (ref 3.5–5.1)
Sodium: 134 mmol/L — ABNORMAL LOW (ref 135–145)

## 2022-01-14 LAB — CBC
HCT: 42 % (ref 36.0–46.0)
Hemoglobin: 14 g/dL (ref 12.0–15.0)
MCH: 28.8 pg (ref 26.0–34.0)
MCHC: 33.3 g/dL (ref 30.0–36.0)
MCV: 86.4 fL (ref 80.0–100.0)
Platelets: 242 10*3/uL (ref 150–400)
RBC: 4.86 MIL/uL (ref 3.87–5.11)
RDW: 13.4 % (ref 11.5–15.5)
WBC: 7.2 10*3/uL (ref 4.0–10.5)
nRBC: 0 % (ref 0.0–0.2)

## 2022-01-14 LAB — TROPONIN I (HIGH SENSITIVITY)
Troponin I (High Sensitivity): 5 ng/L (ref <18)
Troponin I (High Sensitivity): 4 ng/L (ref <18)

## 2022-01-14 LAB — PREGNANCY, URINE: Preg Test, Ur: NEGATIVE

## 2022-01-14 MED ORDER — ALUM & MAG HYDROXIDE-SIMETH 200-200-20 MG/5ML PO SUSP
30.0000 mL | Freq: Once | ORAL | Status: AC
Start: 2022-01-14 — End: 2022-01-14
  Administered 2022-01-14: 30 mL via ORAL
  Filled 2022-01-14: qty 30

## 2022-01-14 MED ORDER — IOHEXOL 350 MG/ML SOLN
80.0000 mL | Freq: Once | INTRAVENOUS | Status: AC | PRN
  Administered 2022-01-14: 80 mL via INTRAVENOUS

## 2022-01-14 MED ORDER — POTASSIUM CHLORIDE CRYS ER 20 MEQ PO TBCR
80.0000 meq | EXTENDED_RELEASE_TABLET | Freq: Once | ORAL | Status: AC
  Administered 2022-01-14: 80 meq via ORAL
  Filled 2022-01-14: qty 4

## 2022-01-14 MED ORDER — SUCRALFATE 1 GM/10ML PO SUSP
1.0000 g | Freq: Three times a day (TID) | ORAL | 0 refills | Status: AC
Start: 2022-01-14 — End: ?

## 2022-01-14 NOTE — ED Provider Notes (Signed)
[facility] EMERGENCY DEPARTMENT Provider Note   CSN: [id number] Arrival date & time: 01/13/22  2354     History  Chief Complaint  Patient presents with   Chest Pain    Elizabeth Hampton is a 46 y.o. female.  The history is provided by the patient and the spouse.  Chest Pain Pain location:  Substernal area Pain quality: dull   Pain radiates to:  Does not radiate Pain severity:  Moderate Onset quality:  Gradual Duration:  4 months Timing:  Constant Progression:  Unchanged Context: at rest   Context: not breathing, not drug use, not lifting, not movement, not raising an arm and not trauma   Relieved by:  Nothing Worsened by:  Nothing Ineffective treatments:  None tried Associated symptoms: no abdominal pain, no fever, no headache, no lower extremity edema, no nausea, no near-syncope, no numbness, no orthopnea, no palpitations, no PND, no shortness of breath, no vomiting and no weakness   Associated symptoms comment:  Seen by cardiology and had a stress test that was normal and told it was an esophageal problem but has not followed up with PMD or GI Risk factors: no aortic disease, no birth control, no coronary artery disease, no diabetes mellitus and no Ehlers-Danlos syndrome   Patient presents with chest pain and tachycardia x 4 months.  Told by cardiology it was her esophagus but she then never follow up with anyone.      Home Medications Prior to Admission medications   Medication Sig Start Date End Date Taking? Authorizing Provider  sucralfate (CARAFATE) 1 GM/10ML suspension Take 10 mLs (1 g total) by mouth 4 (four) times daily -  with meals and at bedtime. 01/14/22  Yes Voshon Petro, MD  pravastatin (PRAVACHOL) 10 MG tablet Take by mouth. 12/04/17   [provider]      Allergies    Atorvastatin and Penicillins    Review of Systems   Review of Systems  Constitutional:  Negative for fever.  HENT:  Negative for dental problem.   Eyes:  Negative for  redness.  Respiratory:  Negative for shortness of breath.   Cardiovascular:  Positive for chest pain. Negative for palpitations, orthopnea, PND and near-syncope.  Gastrointestinal:  Negative for abdominal pain, nausea and vomiting.  Genitourinary:  Negative for dyspareunia.  Musculoskeletal:  Negative for neck pain.  Skin:  Negative for rash.  Neurological:  Negative for weakness, numbness and headaches.  All other systems reviewed and are negative.  Physical Exam Updated Vital Signs BP (!) 129/91 (BP Location: Right Arm)    Pulse 86    Temp 98 F (36.7 C) (Oral)    Resp 16    Ht 5\' 6"  (1.676 m)    Wt 84.8 kg    LMP 01/07/2022    SpO2 97%    BMI 30.18 kg/m  Physical Exam Vitals and nursing note reviewed.  Constitutional:      General: She is not in acute distress.    Appearance: Normal appearance.  HENT:     Head: Normocephalic and atraumatic.     Nose: Nose normal.  Eyes:     Conjunctiva/sclera: Conjunctivae normal.     Pupils: Pupils are equal, round, and reactive to light.  Cardiovascular:     Rate and Rhythm: Normal rate and regular rhythm.     Pulses: Normal pulses.     Heart sounds: Normal heart sounds.  Pulmonary:     Effort: Pulmonary effort is normal.     Breath  sounds: Normal breath sounds.  Abdominal:     General: Abdomen is flat. Bowel sounds are normal.     Palpations: Abdomen is soft.     Tenderness: There is no abdominal tenderness. There is no guarding.  Musculoskeletal:        General: Normal range of motion.     Cervical back: Normal range of motion and neck supple.  Skin:    General: Skin is warm and dry.     Capillary Refill: Capillary refill takes less than 2 seconds.  Neurological:     General: No focal deficit present.     Mental Status: She is alert and oriented to person, place, and time.     Deep Tendon Reflexes: Reflexes normal.  Psychiatric:        Mood and Affect: Mood is anxious.    ED Results / Procedures / Treatments   Labs (all  labs ordered are listed, but only abnormal results are displayed) Labs Reviewed  BASIC METABOLIC PANEL - Abnormal; Notable for the following components:      Result Value   Sodium 134 (*)    Potassium 2.9 (*)    Chloride 97 (*)    Glucose, Bld 111 (*)    All other components within normal limits  CBC  PREGNANCY, URINE  TROPONIN I (HIGH SENSITIVITY)  TROPONIN I (HIGH SENSITIVITY)    EKG EKG Interpretation  Date/Time:  Sunday January 14 2022 00:04:39 EST Ventricular Rate:  102 PR Interval:  130 QRS Duration: 84 QT Interval:  338 QTC Calculation: 440 R Axis:   69 Text Interpretation: Sinus tachycardia Confirmed by Laveyah Oriol ([id number]) on 01/14/2022 2:40:23 AM  Radiology DG Chest 2 View  Result Date: 01/14/2022 CLINICAL DATA:  Chest pain. EXAM: CHEST - 2 VIEW COMPARISON:  12/07/2018 FINDINGS: Low lung volumes.The cardiomediastinal contours are normal. Pulmonary vasculature is normal. No consolidation, pleural effusion, or pneumothorax. No acute osseous abnormalities are seen. IMPRESSION: Low lung volumes without acute chest finding. Electronically Signed   By: Narda Rutherford M.D.   On: 01/14/2022 00:43   CT Angio Chest PE W and/or Wo Contrast  Result Date: 01/14/2022 CLINICAL DATA:  Chest pain and shortness of breath EXAM: CT ANGIOGRAPHY CHEST WITH CONTRAST TECHNIQUE: Multidetector CT imaging of the chest was performed using the standard protocol during bolus administration of intravenous contrast. Multiplanar CT image reconstructions and MIPs were obtained to evaluate the vascular anatomy. RADIATION DOSE REDUCTION: This exam was performed according to the departmental dose-optimization program which includes automated exposure control, adjustment of the mA and/or kV according to patient size and/or use of iterative reconstruction technique. CONTRAST:  50mL OMNIPAQUE IOHEXOL 350 MG/ML SOLN COMPARISON:  Chest x-ray from earlier in the same day. FINDINGS: Cardiovascular: Thoracic  aorta is within normal limits. No aneurysmal dilatation or dissection is noted. No cardiac enlargement is seen. Pulmonary artery shows a normal branching pattern. No filling defect to suggest pulmonary embolism is noted. No coronary calcifications are noted. Mediastinum/Nodes: Thoracic inlet is within normal limits. No sizable hilar or mediastinal adenopathy is noted. The esophagus as visualized is within normal limits. Lungs/Pleura: Lungs are well aerated bilaterally. Minimal dependent atelectatic changes are seen. No focal infiltrate or sizable effusion is noted. Upper Abdomen: Fatty infiltration of the liver is noted. The remainder of the upper abdomen is within normal limits. Musculoskeletal: Mild degenerative changes of the thoracic spine are seen. No acute bony abnormality is noted. Review of the MIP images confirms the above findings. IMPRESSION: No evidence of  pulmonary emboli. Fatty liver. No other focal abnormality is noted. Electronically Signed   By: Alcide Clever M.D.   On: 01/14/2022 03:52    Procedures Procedures    Medications Ordered in ED Medications  iohexol (OMNIPAQUE) 350 MG/ML injection 80 mL (80 mLs Intravenous Contrast Given 01/14/22 0338)  potassium chloride SA (KLOR-CON M) CR tablet 80 mEq (80 mEq Oral Given 01/14/22 0521)  alum & mag hydroxide-simeth (MAALOX/MYLANTA) 200-200-20 MG/5ML suspension 30 mL (30 mLs Oral Given 01/14/22 0521)    ED Course/ Medical Decision Making/ A&P    EKG Interpretation  Date/Time:  Sunday January 14 2022 00:04:39 EST Ventricular Rate:  102 PR Interval:  130 QRS Duration: 84 QT Interval:  338 QTC Calculation: 440 R Axis:   69 Text Interpretation: Sinus tachycardia Confirmed by Nohely Whitehorn ([id number]) on 01/14/2022 2:40:23 AM                                Medical Decision Making Chest pain and shortness of breath ongoing for 4 months.  No exertional symptoms.  No f/c/r no travel    Problems Addressed: Hypokalemia: acute illness or  injury    Details: treated with 80 meQ of potassium Precordial pain: acute illness or injury    Details: GI cocktail given  Amount and/or Complexity of Data Reviewed External Data Reviewed: notes.    Details: seen and reviewed in EPIC Labs: ordered.    Details: reviewed by me:  negative troponin x 2, hypokalemia on metabolic profile, normal creatinine Radiology: ordered.    Details: CTA reviewed  by me is negative for PE and PNA ECG/medicine tests: ordered and independent interpretation performed. Decision-making details documented in ED Course.  Risk OTC drugs. Prescription drug management. Risk Details: Patient has been ruled out for MI in the ED 2 negative troponins and ekg, heart score is 2 low risk for mace.  Ruled out for PE.  This is likely GI with a component of anxiety.  Exam and vitals are benign and reassuring.  Will start carafate and have patient follow up with PMD for ongoing care.      Final Clinical Impression(s) / ED Diagnoses Final diagnoses:  Hypokalemia  Precordial pain   Return for intractable cough, coughing up blood, fevers > 100.4 unrelieved by medication, shortness of breath, intractable vomiting, chest pain, shortness of breath, weakness, numbness, changes in speech, facial asymmetry, abdominal pain, passing out, Inability to tolerate liquids or food, cough, altered mental status or any concerns. No signs of systemic illness or infection. The patient is nontoxic-appearing on exam and vital signs are within normal limits.  I have reviewed the triage vital signs and the nursing notes. Pertinent labs & imaging results that were available during my care of the patient were reviewed by me and considered in my medical decision making (see chart for details). After history, exam, and medical workup I feel the patient has been appropriately medically screened and is safe for discharge home. Pertinent diagnoses were discussed with the patient. Patient was given return  precautions. Rx / DC Orders ED Discharge Orders          Ordered    sucralfate (CARAFATE) 1 GM/10ML suspension  3 times daily with meals & bedtime        02 /19/23 0617              Khole Branch, MD 01/14/22 1610

## 2022-01-14 NOTE — ED Notes (Signed)
Patient taken to CT at this time.

## 2022-01-14 NOTE — ED Triage Notes (Signed)
Pt reports chest pain for weeks. She has had a recent stress test and was told her esophagus is narrow and was put on medicine for acid reflux. States she is still having pain and her BP was elevated tonight 150/113

## 2022-01-14 NOTE — ED Notes (Signed)
Unable to obtain blood pt tensed arm and moved arm when sticking. Verbalized "it hurts to much I can't take it anymore please stop." This NT verbalized that blood work will be obtain when put in treatment room by another staff member. Pt was stuck x2

## 2022-11-23 IMAGING — CT CT ANGIO CHEST
2 of 8 series · 18 of 36 positions shown · IV contrast (Omnipaque)
Comparison: Chest x-ray from earlier in the same day.

CLINICAL DATA: Chest pain and shortness of breath

EXAM:
CT ANGIOGRAPHY CHEST WITH CONTRAST
TECHNIQUE: Multidetector CT imaging of the chest was performed using the
standard protocol during bolus administration of intravenous
contrast. Multiplanar CT image reconstructions and MIPs were
obtained to evaluate the vascular anatomy.

[Series 6: pe coronal mpr · coronal · 0.57mm/px · 1 of 133 slices shown]
[im 67/133  mediastinal]
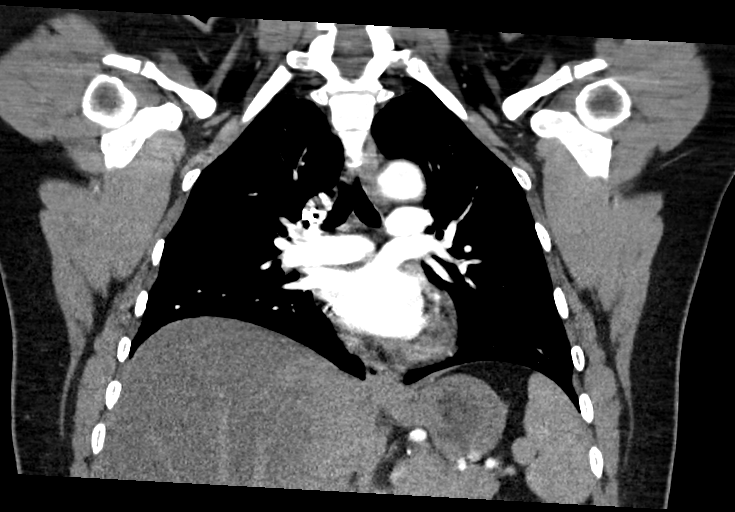

[Series 10: pe thins · axial · 0.84mm/px · z∈[+943,+1181]mm · 17 of 268 slices shown]
[im 15/268  lung]
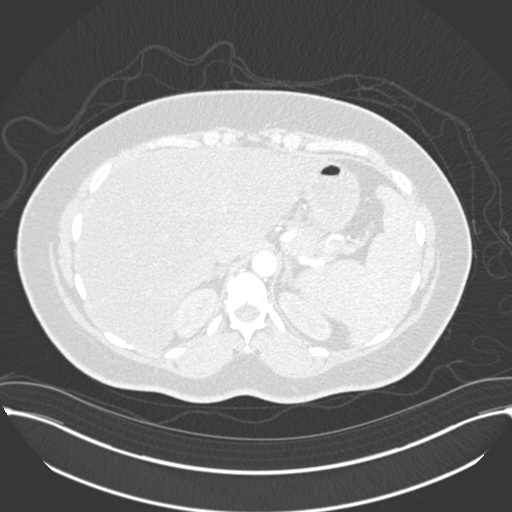
[im 29/268  mediastinal]
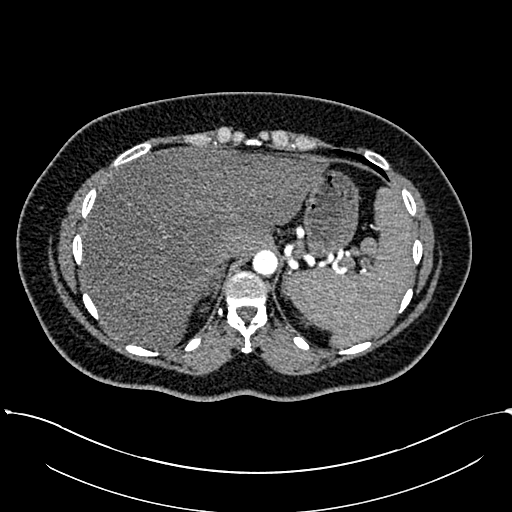
[im 43/268  lung]
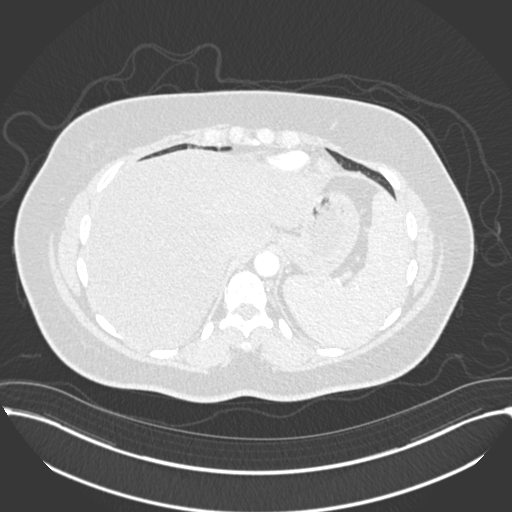
[im 57/268  mediastinal]
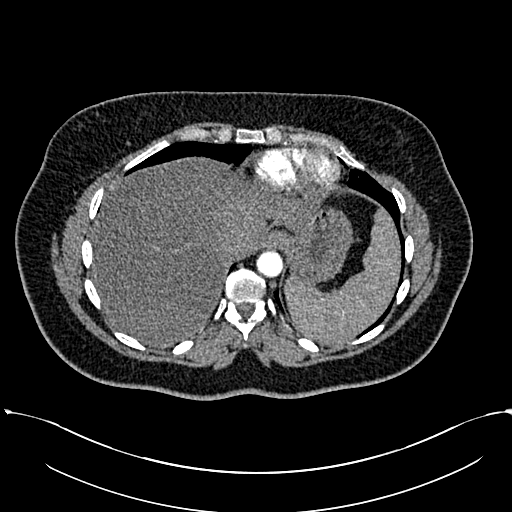
[im 71/268  lung]
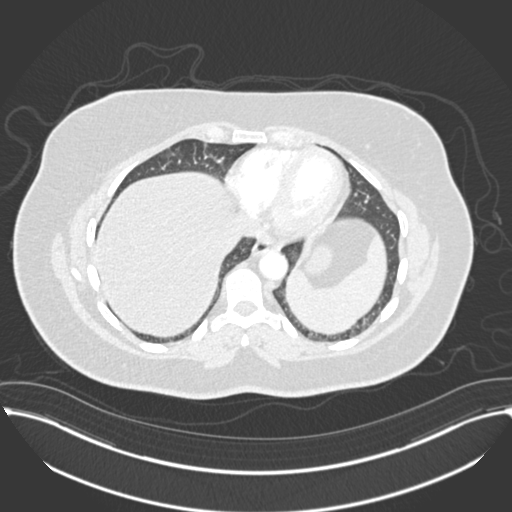
[im 85/268  mediastinal]
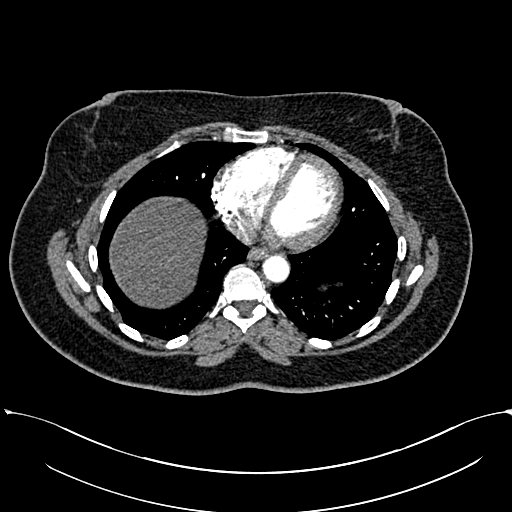
[im 99/268  lung]
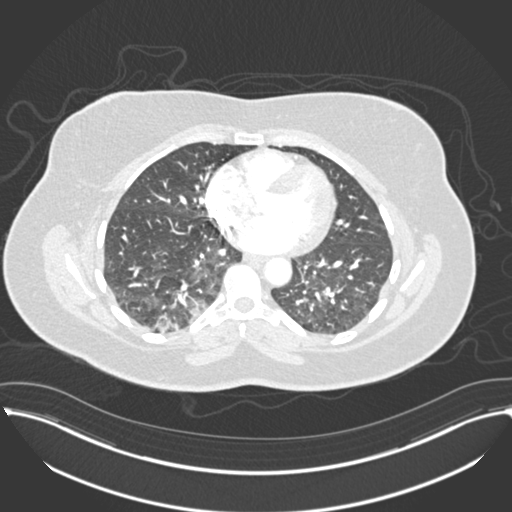
[im 113/268  mediastinal]
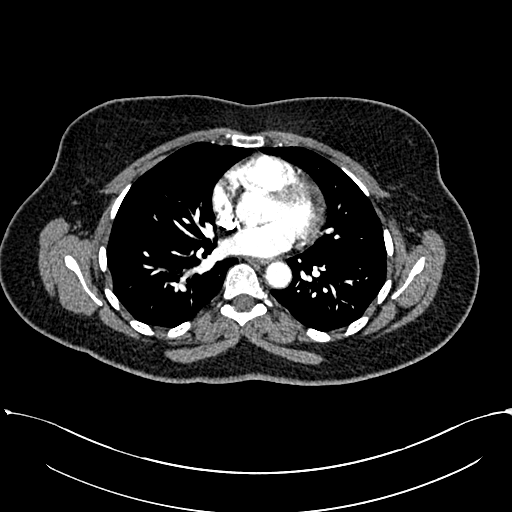
[im 141/268  lung]
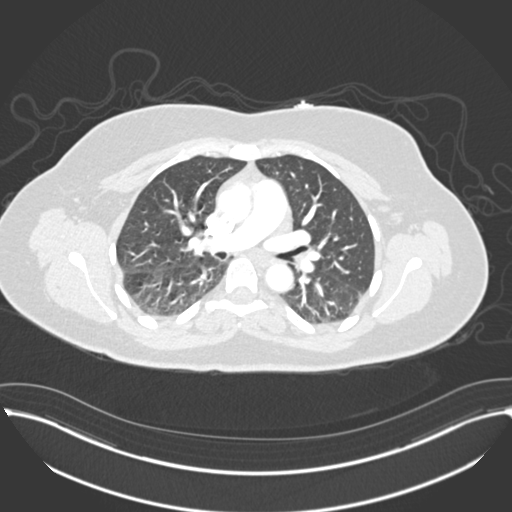
[im 155/268  mediastinal]
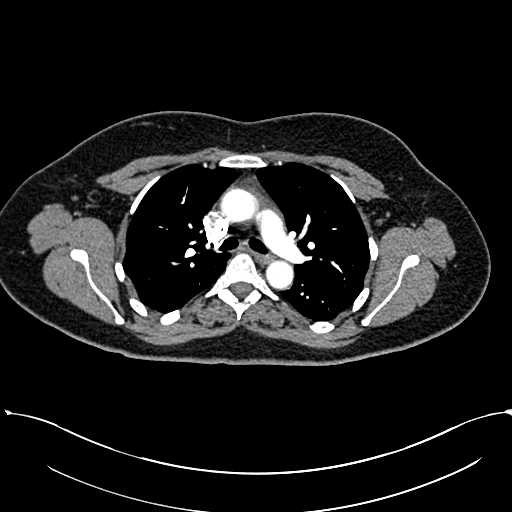
[im 169/268  lung]
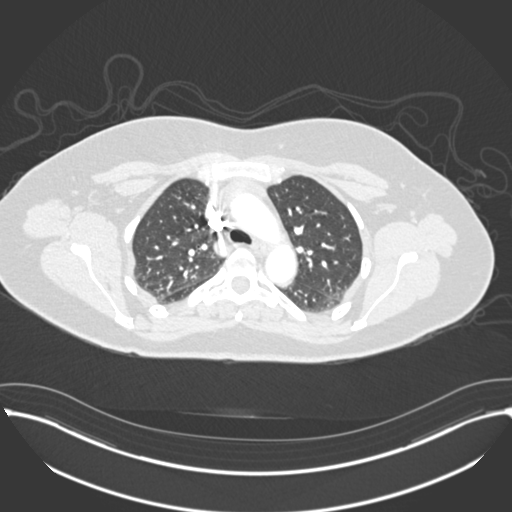
[im 183/268  mediastinal]
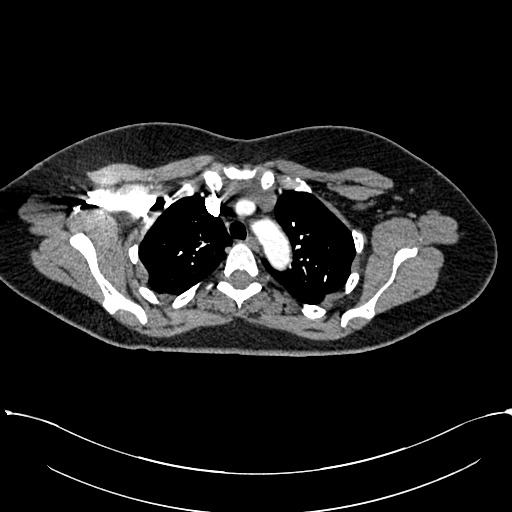
[im 197/268  lung]
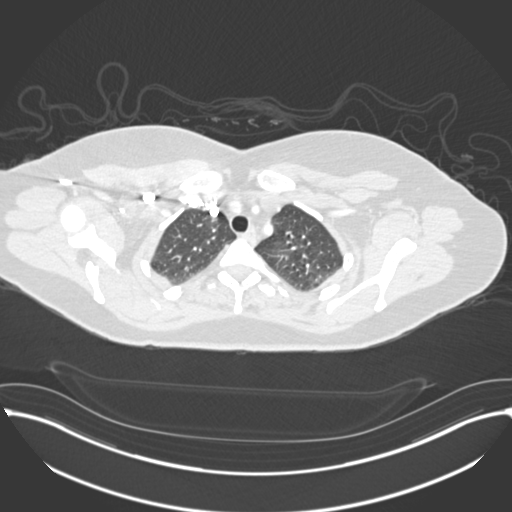
[im 211/268  mediastinal]
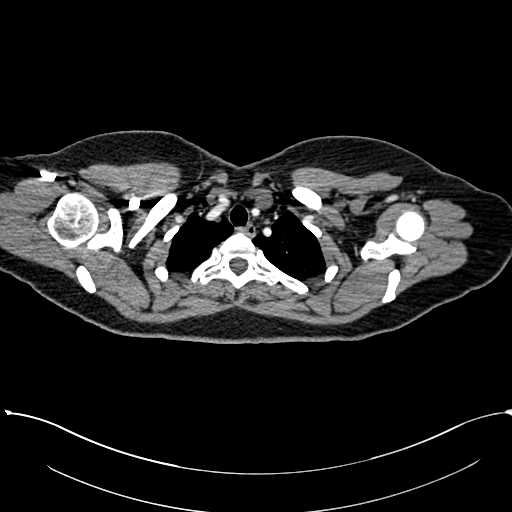
[im 225/268  lung]
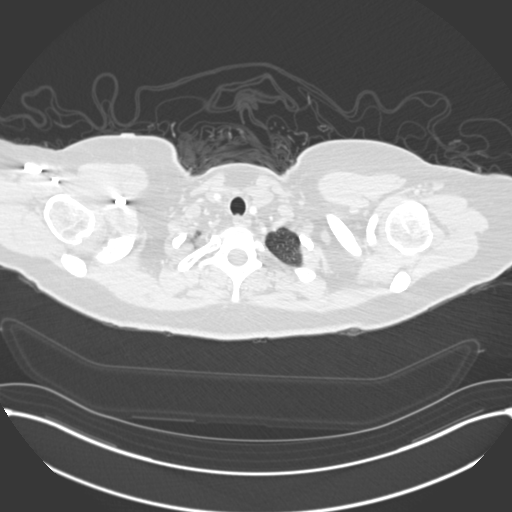
[im 239/268  mediastinal]
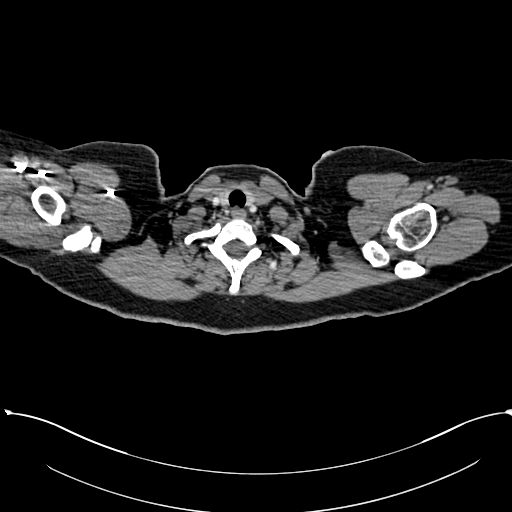
[im 253/268  lung]
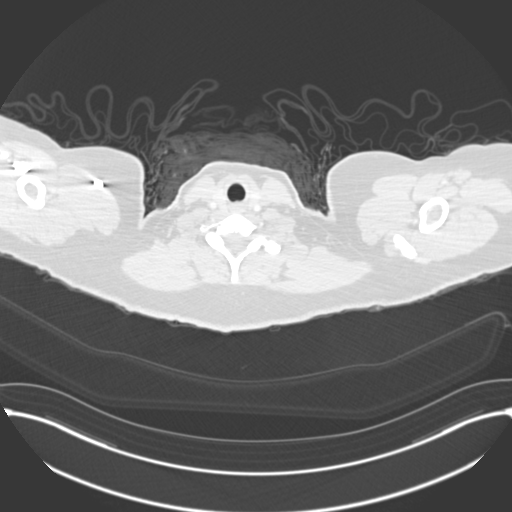

[18 of 36 positions shown; findings below may reference images not displayed]

RADIATION DOSE REDUCTION: This exam was performed according to the
departmental dose-optimization program which includes automated
exposure control, adjustment of the mA and/or kV according to
patient size and/or use of iterative reconstruction technique.

CONTRAST:  80mL OMNIPAQUE IOHEXOL 350 MG/ML SOLN
FINDINGS: Cardiovascular: Thoracic aorta is within normal limits. No
aneurysmal dilatation or dissection is noted. No cardiac enlargement
is seen. Pulmonary artery shows a normal branching pattern. No
filling defect to suggest pulmonary embolism is noted. No coronary
calcifications are noted.

Mediastinum/Nodes: Thoracic inlet is within normal limits. No
sizable hilar or mediastinal adenopathy is noted. The esophagus as
visualized is within normal limits.

Lungs/Pleura: Lungs are well aerated bilaterally. Minimal dependent
atelectatic changes are seen. No focal infiltrate or sizable
effusion is noted.

Upper Abdomen: Fatty infiltration of the liver is noted. The
remainder of the upper abdomen is within normal limits.

Musculoskeletal: Mild degenerative changes of the thoracic spine are
seen. No acute bony abnormality is noted.

Review of the MIP images confirms the above findings.
IMPRESSION: No evidence of pulmonary emboli.

Fatty liver.

No other focal abnormality is noted.

## 2022-12-05 ENCOUNTER — Other Ambulatory Visit: Payer: Self-pay

## 2022-12-05 ENCOUNTER — Emergency Department (HOSPITAL_COMMUNITY): Payer: Medicaid Other

## 2022-12-05 ENCOUNTER — Emergency Department (HOSPITAL_COMMUNITY)
Admission: EM | Admit: 2022-12-05 | Discharge: 2022-12-06 | Disposition: A | Discharge status: Home / Self Care | Payer: Medicaid Other | Attending: Emergency Medicine | Admitting: Emergency Medicine

## 2022-12-05 DIAGNOSIS — R220 Localized swelling, mass and lump, head: Secondary | ICD-10-CM | POA: Diagnosis present

## 2022-12-05 DIAGNOSIS — L03211 Cellulitis of face: Secondary | ICD-10-CM | POA: Diagnosis not present

## 2022-12-05 DIAGNOSIS — K047 Periapical abscess without sinus: Secondary | ICD-10-CM

## 2022-12-05 LAB — CBC WITH DIFFERENTIAL/PLATELET
Abs Immature Granulocytes: 0.02 10*3/uL (ref 0.00–0.07)
Basophils Absolute: 0 10*3/uL (ref 0.0–0.1)
Basophils Relative: 1 %
Eosinophils Absolute: 0.1 10*3/uL (ref 0.0–0.5)
Eosinophils Relative: 1 %
HCT: 41.2 % (ref 36.0–46.0)
Hemoglobin: 13.1 g/dL (ref 12.0–15.0)
Immature Granulocytes: 0 %
Lymphocytes Relative: 18 %
Lymphs Abs: 1.1 10*3/uL (ref 0.7–4.0)
MCH: 28.8 pg (ref 26.0–34.0)
MCHC: 31.8 g/dL (ref 30.0–36.0)
MCV: 90.5 fL (ref 80.0–100.0)
Monocytes Absolute: 0.5 10*3/uL (ref 0.1–1.0)
Monocytes Relative: 9 %
Neutro Abs: 4.3 10*3/uL (ref 1.7–7.7)
Neutrophils Relative %: 71 %
Platelets: 196 10*3/uL (ref 150–400)
RBC: 4.55 MIL/uL (ref 3.87–5.11)
RDW: 14 % (ref 11.5–15.5)
WBC: 6 10*3/uL (ref 4.0–10.5)
nRBC: 0 % (ref 0.0–0.2)

## 2022-12-05 LAB — BASIC METABOLIC PANEL
Anion gap: 9 (ref 5–15)
BUN: 10 mg/dL (ref 6–20)
CO2: 23 mmol/L (ref 22–32)
Calcium: 8.9 mg/dL (ref 8.9–10.3)
Chloride: 103 mmol/L (ref 98–111)
Creatinine, Ser: 0.75 mg/dL (ref 0.44–1.00)
GFR, Estimated: >60 mL/min (ref >60)
Glucose, Bld: 118 mg/dL — ABNORMAL HIGH (ref 70–99)
Potassium: 4.1 mmol/L (ref 3.5–5.1)
Sodium: 135 mmol/L (ref 135–145)

## 2022-12-05 LAB — LACTIC ACID, PLASMA: Lactic Acid, Venous: 1.1 mmol/L (ref 0.5–1.9)

## 2022-12-05 LAB — I-STAT BETA HCG BLOOD, ED (MC, WL, AP ONLY): I-stat hCG, quantitative: <5 m[IU]/mL (ref <5)

## 2022-12-05 MED ORDER — HYDROCODONE-ACETAMINOPHEN 5-325 MG PO TABS
1.0000 | ORAL_TABLET | Freq: Once | ORAL | Status: AC
  Administered 2022-12-05: 1 via ORAL
  Filled 2022-12-05: qty 1

## 2022-12-05 MED ORDER — IOHEXOL 350 MG/ML SOLN
75.0000 mL | Freq: Once | INTRAVENOUS | Status: AC | PRN
  Administered 2022-12-05: 75 mL via INTRAVENOUS

## 2022-12-05 NOTE — ED Notes (Signed)
Pt stated that she has been experiencing swelling and in cheek over the last 2 days and is now experiencing pain and numbness over the last  few hours. VS have been collected with BP of 118/113 which that pt stated was abnormal for her. Sort RN Shirlee Limerick has been notified of pts complaint and vital signs.

## 2022-12-05 NOTE — ED Provider Triage Note (Signed)
Emergency Medicine Provider Triage Evaluation Note  Elizabeth Hampton , a 47 y.o. female  was evaluated in triage.  Pt complains of right-sided facial edema into neck.  Patient has been treated for a dental infection for the past 3 days with clindamycin which she has been compliant with.  Patient notes edema started earlier today.  She admits to trismus.  Significant pain.  No fever or chills.  Review of Systems  Positive: Dental infection Negative: fever  Physical Exam  BP (!) 187/113 (BP Location: Right Arm)   Pulse (!) 111   Temp 98.4 F (36.9 C)   Resp 20   Ht 5\' 6"  (1.676 m)   Wt 84.8 kg   SpO2 97%   BMI 30.18 kg/m  Gen:   Awake, no distress   Resp:  Normal effort  MSK:   Moves extremities without difficulty  Other:  Edema to right side of mandible into neck  Medical Decision Making  Medically screening exam initiated at 8:14 PM.  Appropriate orders placed.  Elizabeth Hampton was informed that the remainder of the evaluation will be completed by another provider, this initial triage assessment does not replace that evaluation, and the importance of remaining in the ED until their evaluation is complete.  Discussed with Ct who recommends CT soft tissue neck with contrast. Labs ordered   Suzy Bouchard, PA-C 12/05/22 2019

## 2022-12-05 NOTE — ED Triage Notes (Signed)
Pt c/o right sides facial/oral swelling, increased numbness, pain, and difficulty swallowing x3 days.

## 2022-12-05 NOTE — ED Notes (Signed)
Pt complaining of increased swelling in face and decreased sensation in right arm. No unilateral weakness noted in patient. Triage staff notified and patient on list to be reassessed by provider.

## 2022-12-06 ENCOUNTER — Encounter (HOSPITAL_COMMUNITY): Payer: Self-pay

## 2022-12-06 LAB — CBC
HCT: 36.8 % (ref 36.0–46.0)
Hemoglobin: 11.9 g/dL — ABNORMAL LOW (ref 12.0–15.0)
MCH: 28.9 pg (ref 26.0–34.0)
MCHC: 32.3 g/dL (ref 30.0–36.0)
MCV: 89.3 fL (ref 80.0–100.0)
Platelets: 175 10*3/uL (ref 150–400)
RBC: 4.12 MIL/uL (ref 3.87–5.11)
RDW: 14.1 % (ref 11.5–15.5)
WBC: 6.4 10*3/uL (ref 4.0–10.5)
nRBC: 0 % (ref 0.0–0.2)

## 2022-12-06 LAB — LACTIC ACID, PLASMA: Lactic Acid, Venous: 0.8 mmol/L (ref 0.5–1.9)

## 2022-12-06 MED ORDER — SODIUM CHLORIDE 0.9 % IV SOLN
3.0000 g | Freq: Once | INTRAVENOUS | Status: AC
  Administered 2022-12-06: 3 g via INTRAVENOUS
  Filled 2022-12-06: qty 8

## 2022-12-06 MED ORDER — IBUPROFEN 600 MG PO TABS: 600.0000 mg | ORAL_TABLET | Freq: Four times a day (QID) | ORAL | 0 refills | Status: AC | PRN

## 2022-12-06 MED ORDER — AMOXICILLIN-POT CLAVULANATE 875-125 MG PO TABS: 1.0000 | ORAL_TABLET | Freq: Two times a day (BID) | ORAL | 0 refills | Status: AC

## 2022-12-06 MED ORDER — KETOROLAC TROMETHAMINE 30 MG/ML IJ SOLN
30.0000 mg | Freq: Once | INTRAMUSCULAR | Status: AC
  Administered 2022-12-06: 30 mg via INTRAVENOUS
  Filled 2022-12-06: qty 1

## 2022-12-06 MED ORDER — OXYCODONE-ACETAMINOPHEN 5-325 MG PO TABS
1.0000 | ORAL_TABLET | Freq: Once | ORAL | Status: AC
  Administered 2022-12-06: 1 via ORAL
  Filled 2022-12-06: qty 1

## 2022-12-06 MED ORDER — LACTATED RINGERS IV BOLUS
1000.0000 mL | Freq: Once | INTRAVENOUS | Status: AC
  Administered 2022-12-06: 1000 mL via INTRAVENOUS

## 2022-12-06 MED ORDER — LIDOCAINE HCL (PF) 1 % IJ SOLN
5.0000 mL | Freq: Once | INTRAMUSCULAR | Status: AC
  Administered 2022-12-06: 5 mL
  Filled 2022-12-06: qty 5

## 2022-12-06 MED ORDER — HYDROCODONE-ACETAMINOPHEN 5-325 MG PO TABS: 1.0000 | ORAL_TABLET | Freq: Four times a day (QID) | ORAL | 0 refills | Status: AC | PRN

## 2022-12-06 MED ORDER — METHYLPREDNISOLONE SODIUM SUCC 125 MG IJ SOLR
125.0000 mg | Freq: Once | INTRAMUSCULAR | Status: AC
  Administered 2022-12-06: 125 mg via INTRAVENOUS
  Filled 2022-12-06: qty 2

## 2022-12-06 NOTE — ED Provider Notes (Signed)
Brought back to triage for increased pain/swelling right face.  She does have swelling along lower jaw.  Continues handling secretions well, normal phonation, without stridor.  She is tolerating PO fluids currently.  Discussed CT results with patient and husband, awaiting acute bed assignment for full evaluation/treatment.  Given additional pain medication per request.   Larene Pickett, PA-C 12/06/22 0055    Palumbo, April, MD 12/06/22 1103

## 2022-12-06 NOTE — ED Provider Notes (Signed)
[facility] EMERGENCY DEPARTMENT Provider Note   CSN: [id number] Arrival date & time: 12/05/22  1855     History  Chief Complaint  Patient presents with   Facial Swelling    Elizabeth Hampton is a 47 y.o. female.  HPI Patient reports she has had 3 days of pain at the right lower jaw.  She was treated for a dental infection with clindamycin which she has been taking for 3 days.  She reports that the pain and swelling however got significantly worse and less in the past 24 hours.  She reports now she has firmness and noticed some yellow mucosal changes inside of her mouth.  She reports it became very painful.  She reports she feels achy and like her muscles are generally weak.  She has not had a fever that she has measured.  Patient reports she had a pelvic surgery within about the last month.  No acute complaints regarding this.  Patient reports she was just concerned about getting a severe infection when she is recently had a lot of medical conditions treated.    Home Medications Prior to Admission medications   Medication Sig Start Date End Date Taking? Authorizing Provider  amoxicillin-clavulanate (AUGMENTIN) 875-125 MG tablet Take 1 tablet by mouth 2 (two) times daily. 12/06/22  Yes Arby Barrette, MD  HYDROcodone-acetaminophen (NORCO/VICODIN) 5-325 MG tablet Take 1-2 tablets by mouth every 6 (six) hours as needed. 12/06/22  Yes Arby Barrette, MD  ibuprofen (ADVIL) 600 MG tablet Take 1 tablet (600 mg total) by mouth every 6 (six) hours as needed. 12/06/22  Yes Arby Barrette, MD  pravastatin (PRAVACHOL) 10 MG tablet Take by mouth. 12/04/17   [provider]  sucralfate (CARAFATE) 1 GM/10ML suspension Take 10 mLs (1 g total) by mouth 4 (four) times daily -  with meals and at bedtime. 01/14/22   Palumbo, April, MD      Allergies    Atorvastatin and Penicillins    Review of Systems   Review of Systems  Physical Exam Updated Vital Signs BP (!) 132/98    Pulse 98   Temp 98 F (36.7 C)   Resp 18   Ht 5\' 6"  (1.676 m)   Wt 84.8 kg   SpO2 98%   BMI 30.18 kg/m  Physical Exam Constitutional:      Comments: Patient is alert and nontoxic.  Well-nourished well-developed.  HENT:     Head:     Comments: Patient has moderate to large swelling of the right mandible area and submandibular area.    Nose: Nose normal.     Mouth/Throat:     Comments: Palpation of the lower lip to the right is firm and indurated.  Patient does have a pocket right in the buccal fold adjacent to the lower first molar and incisor where there is some yellow debris.  Face is generally very firm and tender along the mandible and the submandibular area on the right.  The dentition generally is in good condition to inspection.  Posterior airway is widely patent.  No stridor.  No difficulty handling secretions. Cardiovascular:     Rate and Rhythm: Normal rate and regular rhythm.  Pulmonary:     Effort: Pulmonary effort is normal.     Breath sounds: Normal breath sounds.  Abdominal:     General: There is no distension.     Palpations: Abdomen is soft.     Tenderness: There is no abdominal tenderness. There is no guarding.  Musculoskeletal:  General: No swelling or tenderness. Normal range of motion.     Right lower leg: No edema.     Left lower leg: No edema.  Skin:    General: Skin is warm and dry.  Neurological:     General: No focal deficit present.     Mental Status: She is oriented to person, place, and time.     Motor: No weakness.     Coordination: Coordination normal.  Psychiatric:        Mood and Affect: Mood normal.        ED Results / Procedures / Treatments   Labs (all labs ordered are listed, but only abnormal results are displayed) Labs Reviewed  BASIC METABOLIC PANEL - Abnormal; Notable for the following components:      Result Value   Glucose, Bld 118 (*)    All other components within normal limits  CBC - Abnormal; Notable for the  following components:   Hemoglobin 11.9 (*)    All other components within normal limits  CBC WITH DIFFERENTIAL/PLATELET  LACTIC ACID, PLASMA  LACTIC ACID, PLASMA  I-STAT BETA HCG BLOOD, ED ([facility], [facility], [facility] ONLY)    EKG None  Radiology CT Soft Tissue Neck W Contrast  Result Date: 12/05/2022 CLINICAL DATA:  Initial evaluation for soft tissue infection. EXAM: CT NECK WITH CONTRAST TECHNIQUE: Multidetector CT imaging of the neck was performed using the standard protocol following the bolus administration of intravenous contrast. RADIATION DOSE REDUCTION: This exam was performed according to the departmental dose-optimization program which includes automated exposure control, adjustment of the mA and/or kV according to patient size and/or use of iterative reconstruction technique. CONTRAST:  49mL OMNIPAQUE IOHEXOL 350 MG/ML SOLN COMPARISON:  None Available. FINDINGS: Pharynx and larynx: Soft tissue swelling with inflammatory stranding seen adjacent to the right mandibular body, concerning for infection/cellulitis. Apical lucencies seen at the root of the right first mandibular molar, suspected to be the source of infection. Suspected tiny/thin odontogenic abscess along the buccal aspect of the right mandibular body measures up to 8 mm (series 3, image 73). Oral cavity, oropharynx, and nasopharynx within normal limits. No retropharyngeal collection or swelling. Negative epiglottis. Remainder of the hypopharynx, supraglottic larynx, glottis, and subglottic larynx are within normal limits. Salivary glands: Salivary glands including the parotid and submandibular glands are within normal limits. Thyroid: Normal. Lymph nodes: Mildly prominent right upper cervical lymph nodes, largest of which measures 11 mm at level 2, presumably reactive. Vascular: Normal intravascular enhancement seen throughout the neck. Limited intracranial: Unremarkable. Visualized orbits: Unremarkable. Mastoids and visualized paranasal  sinuses: Clear. Skeleton: No discrete or worrisome osseous lesions. Mild spondylosis noted at C5-6. Upper chest: Visualized upper chest demonstrates no acute finding. Other: None. IMPRESSION: 1. Soft tissue swelling with inflammatory stranding adjacent to the right mandibular body, concerning for infection/cellulitis. Apical lucency at the root of the right first mandibular molar, suspected to be the source of infection. Probable tiny/thin odontogenic abscess along the buccal aspect of the right mandibular body measures up to 8 mm. 2. Mildly prominent right upper cervical lymph nodes, presumably reactive. Electronically Signed   By: Rise Mu M.D.   On: 12/05/2022 22:07    Procedures .Marland KitchenIncision and Drainage  Date/Time: 12/06/2022 12:48 PM  Performed by: Arby Barrette, MD Authorized by: Arby Barrette, MD   Consent:    Consent obtained:  Verbal   Consent given by:  Patient   Risks discussed:  Bleeding, incomplete drainage, infection and pain Location:    Type:  Abscess   Location:  Mouth   Mouth location:  Alveolar process Anesthesia:    Anesthesia method:  Local infiltration   Local anesthetic:  Lidocaine 1% w/o epi Procedure type:    Complexity:  Simple Procedure details:    Incision types:  Stab incision   Incision depth:  Submucosal   Drainage:  Bloody   Drainage amount:  Scant   Wound treatment:  Wound left open Post-procedure details:    Procedure completion:  Tolerated well, no immediate complications Comments:     Patient was numbed with 1% lidocaine.  A small stab incision made over area of fluctuance near a draining area on buccal mucosa.  Some blood and scant drainage.     Medications Ordered in ED Medications  HYDROcodone-acetaminophen (NORCO/VICODIN) 5-325 MG per tablet 1 tablet (1 tablet Oral Given 12/05/22 2024)  iohexol (OMNIPAQUE) 350 MG/ML injection 75 mL (75 mLs Intravenous Contrast Given 12/05/22 2138)  oxyCODONE-acetaminophen (PERCOCET/ROXICET)  5-325 MG per tablet 1 tablet (1 tablet Oral Given 12/06/22 0057)  methylPREDNISolone sodium succinate (SOLU-MEDROL) 125 mg/2 mL injection 125 mg (125 mg Intravenous Given 12/06/22 0933)  Ampicillin-Sulbactam (UNASYN) 3 g in sodium chloride 0.9 % 100 mL IVPB (0 g Intravenous Stopped 12/06/22 1107)  lactated ringers bolus 1,000 mL (0 mLs Intravenous Stopped 12/06/22 1107)  ketorolac (TORADOL) 30 MG/ML injection 30 mg (30 mg Intravenous Given 12/06/22 0933)  lidocaine (PF) (XYLOCAINE) 1 % injection 5 mL (5 mLs Infiltration Given by Other 12/06/22 1157)    ED Course/ Medical Decision Making/ A&P                           Medical Decision Making Amount and/or Complexity of Data Reviewed Labs: ordered.  Risk Prescription drug management.   Patient presents as outlined with 3 days of treatment for a dental infection with clindamycin.  Symptoms have significantly worsened within the past 24 hours.  Patient now has large facial swelling and a lot of pain.  She does not have fever but generally feels weak and achy with some constitutional symptoms.  Labs and CT obtained through triage.  CT scan reviewed by radiology shows small lucency at the base of first molar raising suspicion for apical abscess.  Did not make note of any focal significant abscess.  Radiology describes significant soft tissue stranding and swelling consistent with cellulitis.  These findings do correlate with exam.  On exam patient has a pocket of yellow exudative or purulent material.  I suspect spontaneous rupture.  Patient is very tender to pressure on the face.  With extensive swelling and pain despite 3 days of clindamycin, will use IV antibiotics and Solu-Medrol.  Patient has reported penicillin allergy.  I reviewed this with her.  She reports she was told from childhood that she had gotten a rash.  Patient does not have recall for a specific reaction.  Will administer with caution and reassess as she has failed outpatient treatment  on clindamycin.  Patient will be given Toradol for pain control and a liter of IV fluids for rehydration.   Patient got good pain relief with Toradol.  On repeat palpation there seem to be an area of fluctuance adjacent to active drainage of purulent material.  I did express some more pus from this.  I did a stab incision in this area but no more significant purulent drainage.  I believe it had already drained to the extent that there was an actual fluid collection.  Patient does have significant facial swelling and cellulitis.  She was given 1 dose of IV Unasyn which she tolerated well.  There was no allergic reaction.  She felt improved after Solu-Medrol and Toradol.  At this time will add Augmentin and have patient continue her clindamycin.  Will have her do swishes with either half hydroperoxide and water or salt water swishes to keep the wound open and draining.  I have counseled the patient that she needs follow-up as soon as possible with oral maxillofacial surgery.  She advises her dentist did not feel that this was a dental problem to be managed in the office.  There is no OMFS on-call today.  The patient needs to contact her dentist for referral.  Careful return precautions reviewed.        Final Clinical Impression(s) / ED Diagnoses Final diagnoses:  Dental abscess  Facial swelling  Facial cellulitis    Rx / DC Orders ED Discharge Orders          Ordered    amoxicillin-clavulanate (AUGMENTIN) 875-125 MG tablet  2 times daily        12/06/22 1238    HYDROcodone-acetaminophen (NORCO/VICODIN) 5-325 MG tablet  Every 6 hours PRN        12/06/22 1238    ibuprofen (ADVIL) 600 MG tablet  Every 6 hours PRN        12/06/22 1238              Charlesetta Shanks, MD 12/06/22 1252

## 2022-12-06 NOTE — ED Notes (Signed)
Pt stating "I need to see a doctor right now, everyone has been ignoring me" and asking to speak to a [employer].  Elizabeth Hampton is checking to see if she an get any medication at this time to keep her comfortable.   We will continue to monitor her

## 2022-12-06 NOTE — Discharge Instructions (Addendum)
1.  You are being prescribed Augmentin.  This is an antibiotic.  Take it twice daily and start it this evening.  Continue your prescribed clindamycin. 2.  Take ibuprofen 600 mg every 6-8 hours for your basic pain control.  Take this with food.  If you need additional pain control, you may take 1-2 Vicodin tablets as well.  You were given a dose of steroid in the emergency department which should start to help with swelling. 3.  Small incision was made in your gum.  Swish your mouth with salt water solution or a solution of half water and half hydrogen peroxide several times a day to keep this area open and draining.  Your abscess did start draining while you are in the emergency department.  This should start to help significantly.  If you are getting increasing swelling and not seeing improvement, developing a fever or worsening pain, you must return for recheck. 4.  There are no oral surgeons on-call for the [facility] emergency department system at this time.  You may choose an oral surgeon locally and call their office or ideally, I have your dentist refer you to one.  It is very important that you get this follow-up.  Your CT scan suggest there is a small amount of infection around the root of the tooth.  This must be treated by a surgeon or dentist because it may come back even as you improve temporarily with antibiotics.

## 2022-12-06 NOTE — ED Notes (Signed)
MD at bedside performing I&D

## 2022-12-06 NOTE — ED Notes (Signed)
I&D tray at bedside, MD made aware.
